# Patient Record
Sex: Male | Born: 2005 | Race: White | Hispanic: No | Marital: Single | Smoking: Never smoker
Health system: Southern US, Community
[De-identification: ages and names within clinical notes are randomized; demographics above are authoritative.]

---
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
## 2023-09-24 DIAGNOSIS — S93621A Sprain of tarsometatarsal ligament of right foot, initial encounter: Secondary | ICD-10-CM | POA: Diagnosis not present
